# Patient Record
Sex: Female | Born: 2000 | Race: White | Hispanic: No | Marital: Single | Smoking: Current every day smoker
Health system: Southern US, Community
[De-identification: ages and names within clinical notes are randomized; demographics above are authoritative.]

## PROBLEM LIST (undated history)

## (undated) DIAGNOSIS — F909 Attention-deficit hyperactivity disorder, unspecified type: Secondary | ICD-10-CM

---
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
## 2021-02-21 IMAGING — CR DG CHEST 2V
2 series · 2 of 2 positions shown · non-contrast
Comparison: None

CLINICAL DATA: Sore throat for 3 days.

EXAM:
CHEST - 2 VIEW

[chest pa]
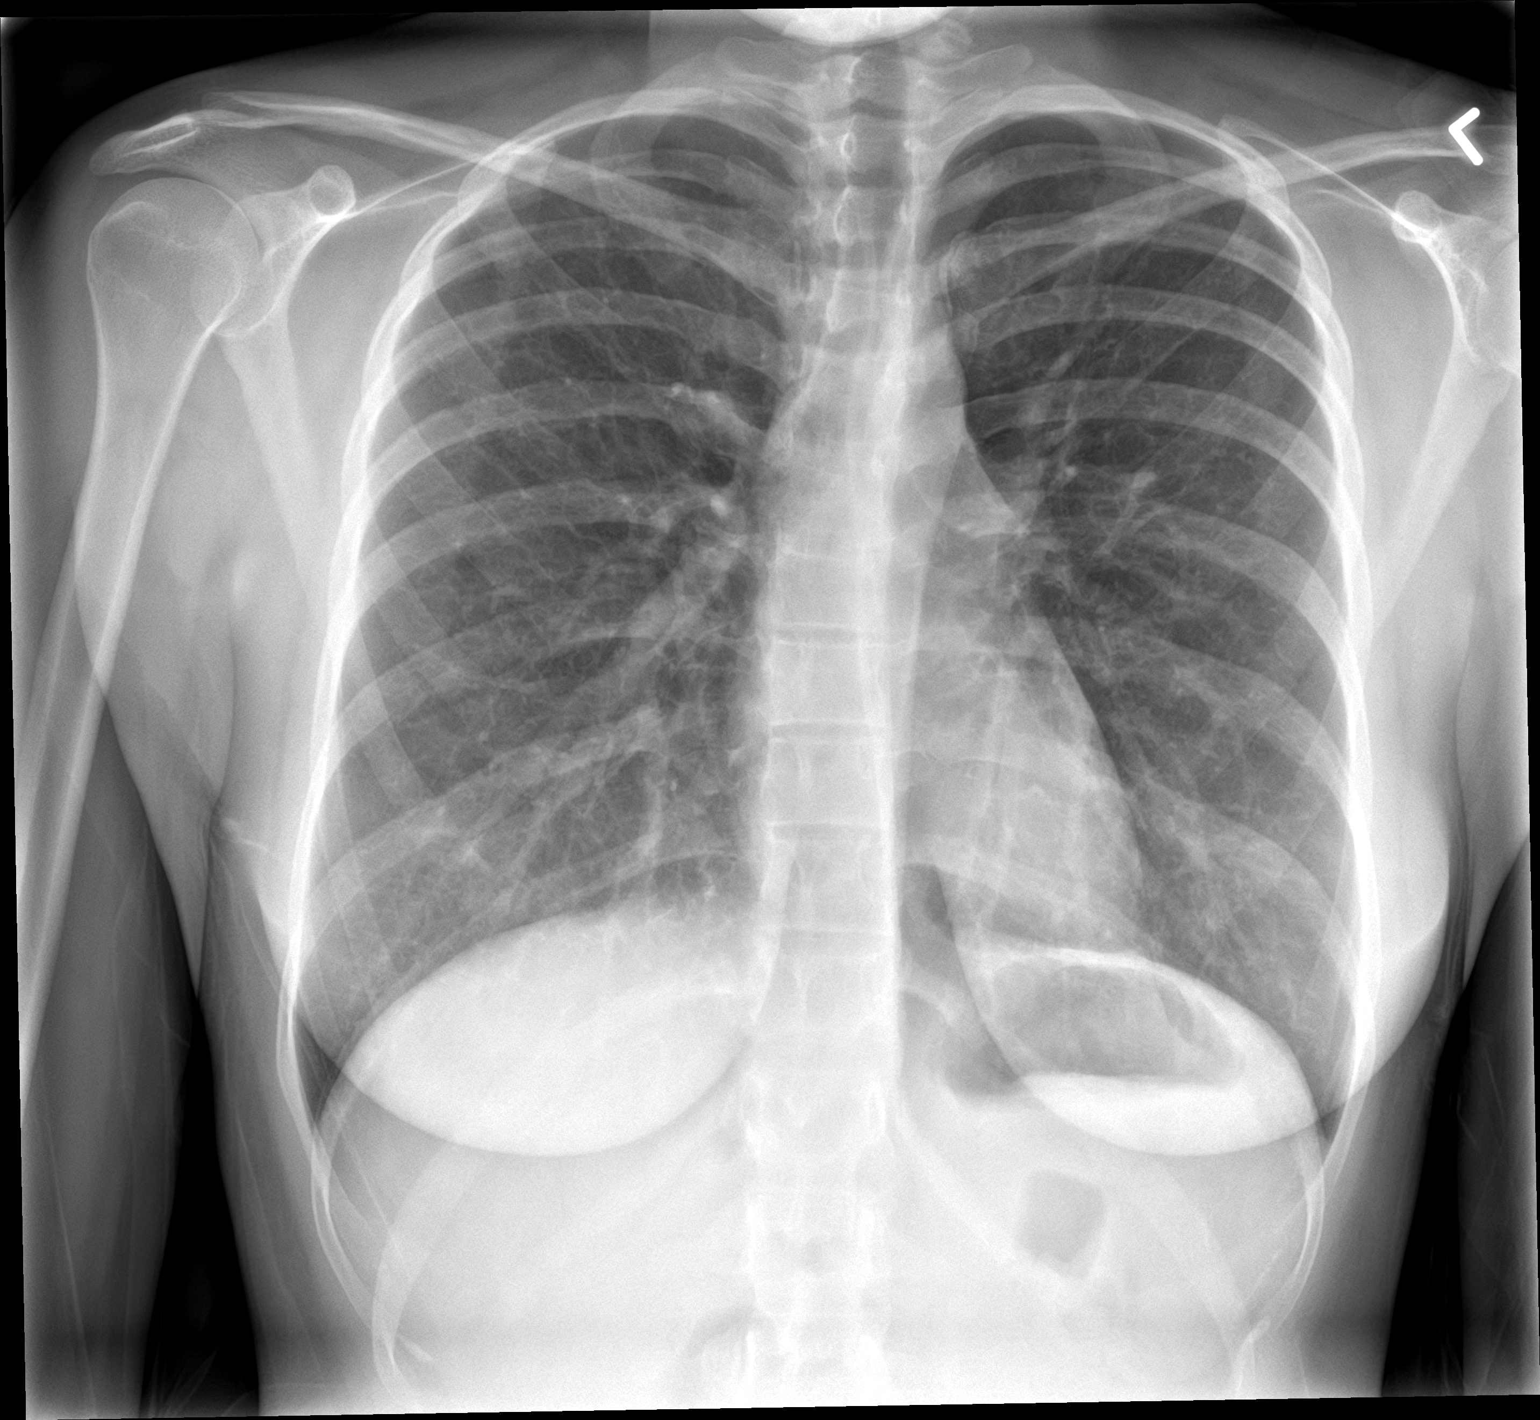

[chest lat]
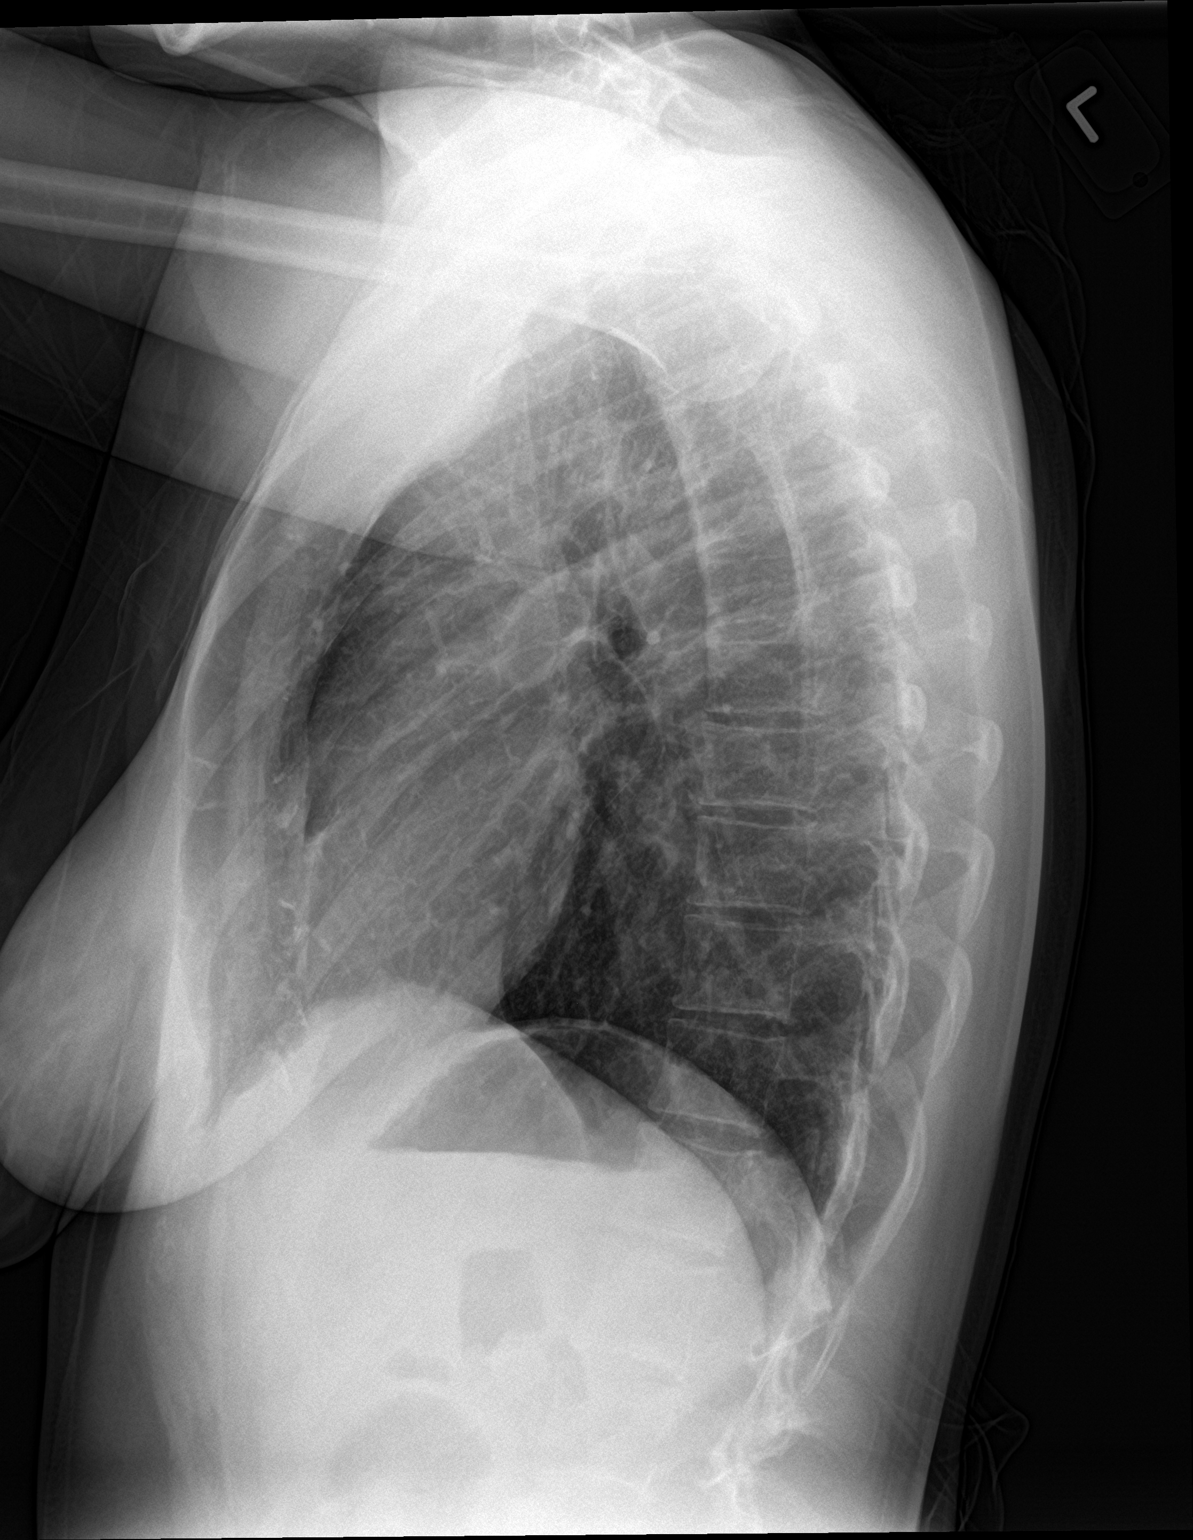

[2 of 2 positions shown; findings below may reference images not displayed]

FINDINGS: Cardiomediastinal contours are normal. Hilar structures are
unremarkable.

Lungs are clear.

No acute bone finding.
IMPRESSION: Normal chest is

## 2021-05-29 IMAGING — CR DG ANKLE 2V *R*
2 series · 2 of 2 positions shown · non-contrast
Comparison: None.

CLINICAL DATA: Lateral ankle pain, swelling and bruising, injury

EXAM:
RIGHT ANKLE - 2 VIEW

[ankle ap]
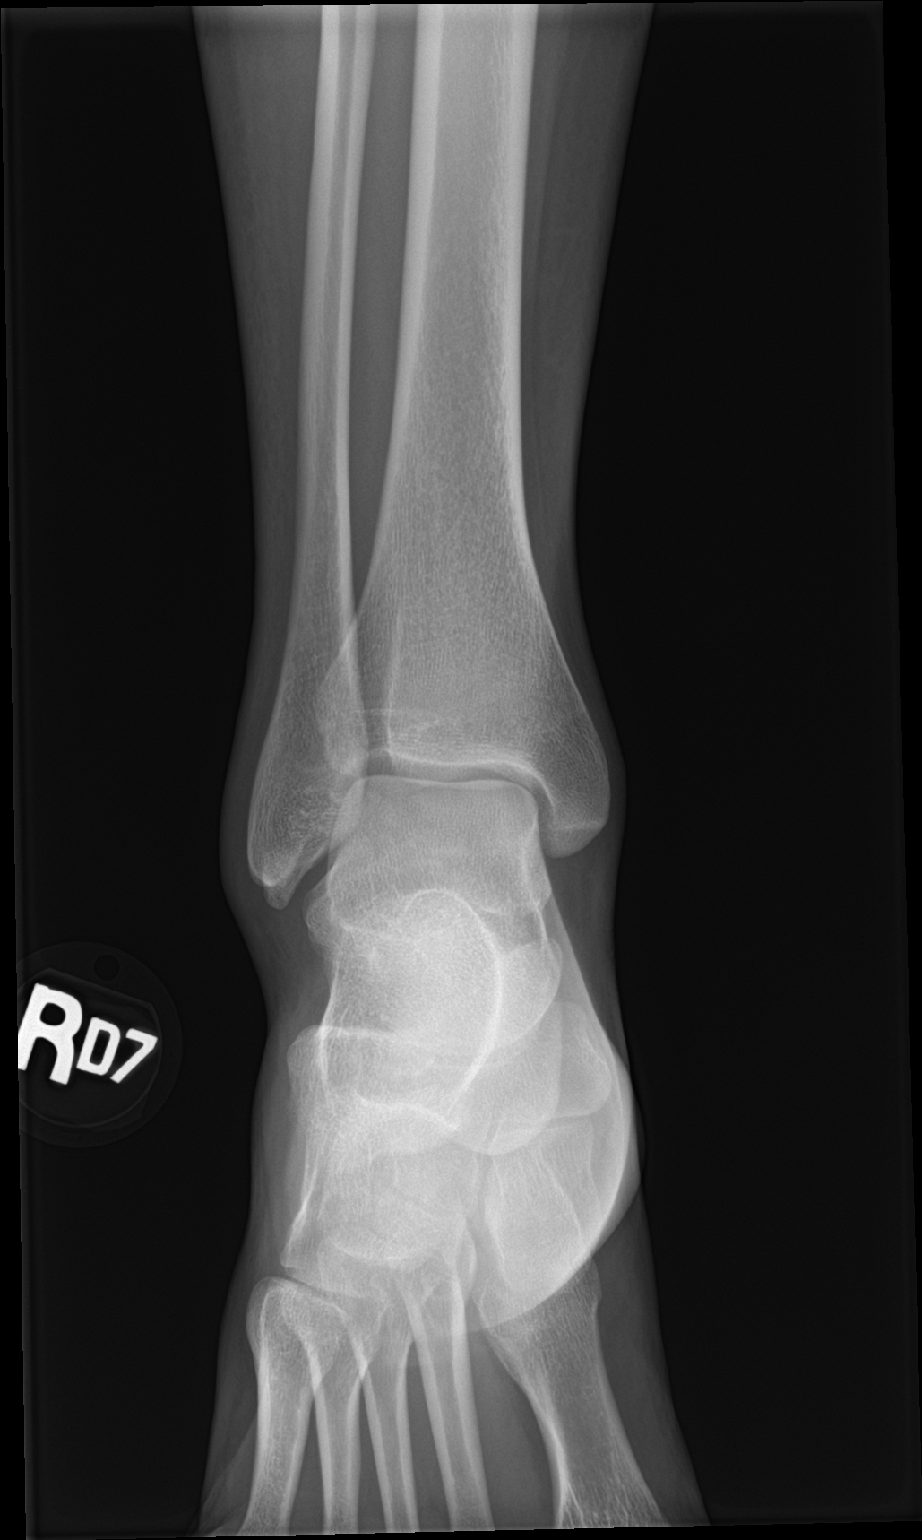

[ankle lat]
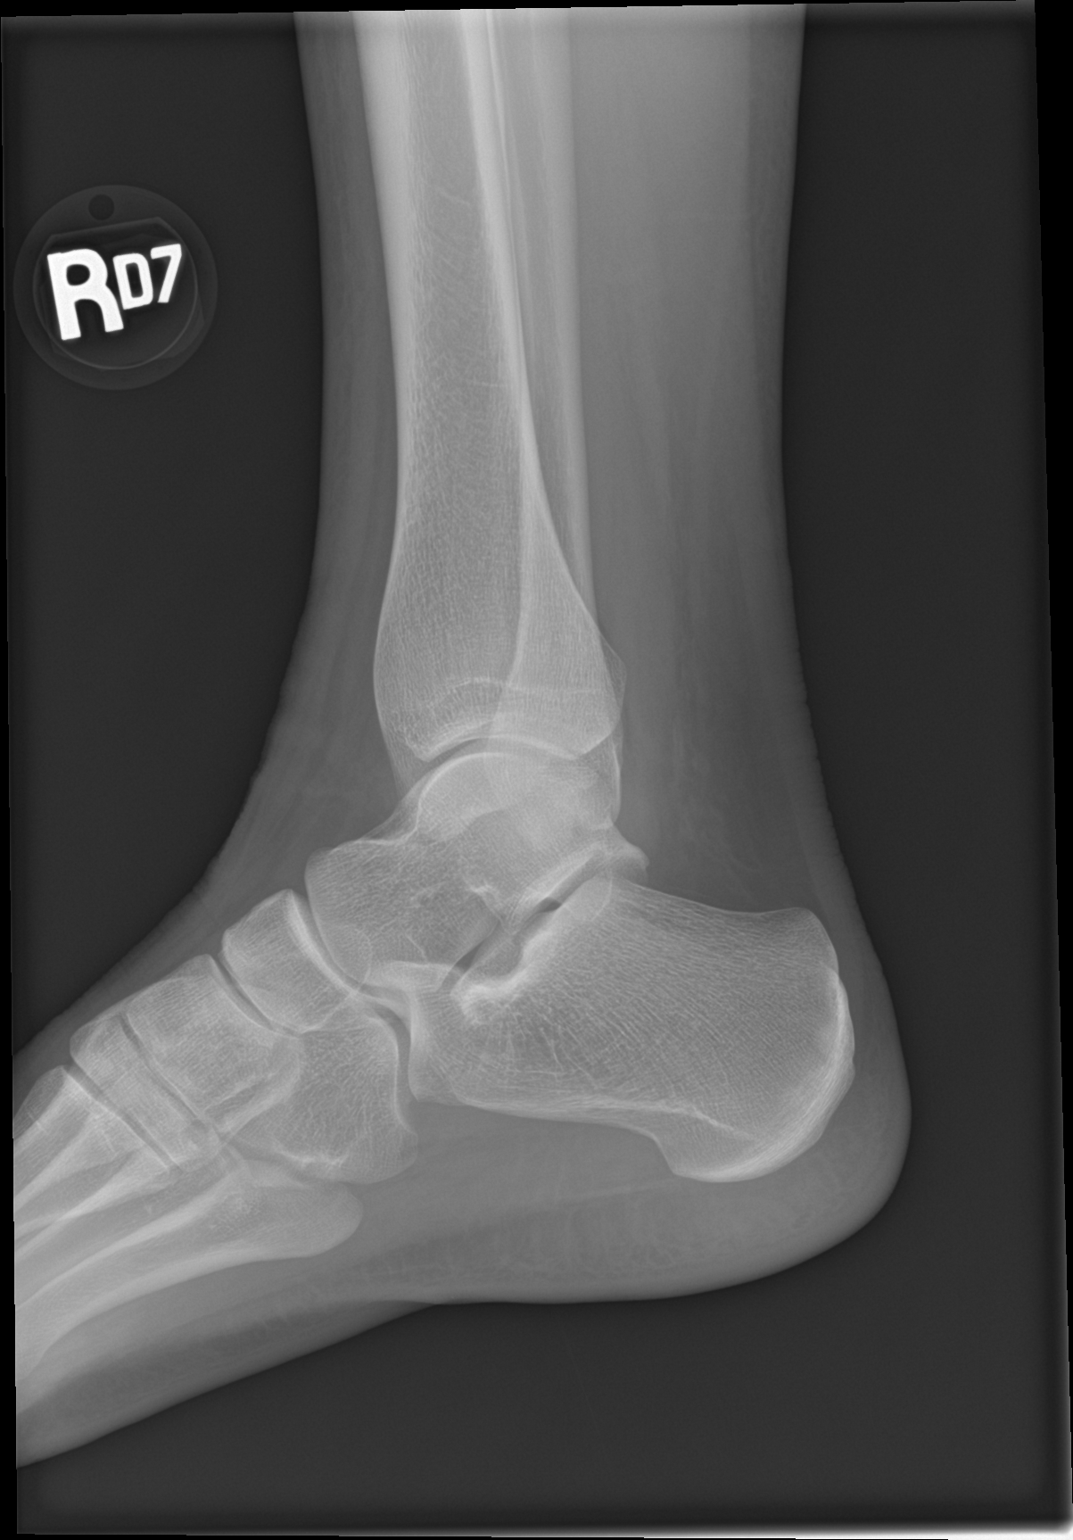

[2 of 2 positions shown; findings below may reference images not displayed]

FINDINGS: There is no evidence of fracture, dislocation, or joint effusion.
There is no evidence of arthropathy or other focal bone abnormality.
Soft tissues are unremarkable.
IMPRESSION: Negative.

## 2021-11-24 ENCOUNTER — Encounter: Payer: Self-pay | Admitting: Adult Health

## 2021-11-24 ENCOUNTER — Ambulatory Visit (INDEPENDENT_AMBULATORY_CARE_PROVIDER_SITE_OTHER): Payer: 59 | Admitting: Adult Health

## 2021-11-24 ENCOUNTER — Other Ambulatory Visit: Payer: Self-pay

## 2021-11-24 VITALS — BP 92/62 | HR 95 | Temp 97.9°F | Ht 64.0 in | Wt 133.0 lb

## 2021-11-24 DIAGNOSIS — J039 Acute tonsillitis, unspecified: Secondary | ICD-10-CM

## 2021-11-24 DIAGNOSIS — Z720 Tobacco use: Secondary | ICD-10-CM | POA: Diagnosis not present

## 2021-11-24 LAB — POCT RAPID STREP A (OFFICE): Rapid Strep A Screen: NEGATIVE

## 2021-11-24 NOTE — Progress Notes (Signed)
[facility] [address] [place], [place] [address] Phone: [phone] Fax: [phone]   Office Visit Note  Patient Name: Paula Orr  Date of Birth:Feb 05, 2001  Med Rec number [id number]  Date of Service: 11/24/2021  Patient has no known allergies.  Chief Complaint  Patient presents with   Sore Throat     Sore Throat  Pertinent negatives include no coughing, diarrhea, ear pain or vomiting.    Patient reports 3 days ago she noticed swollen lymph nodes and has been having difficulty sleeping.  She reports tonsil enlargement, body aches, painful swallowing and chills. Roommate is currently sick, but with different symptoms. She has taken ibuprofen, Nyquil and Dayquil with no real change in symptoms.  Take a hot shower helps, sleeping makes it worse.  Patient does not want to take antibiotics unless she has to. Has a previous RX for Augmentin that she never started.  It is not expired.   Current Medication:  Outpatient Encounter Medications as of 11/24/2021  Medication Sig   amphetamine-dextroamphetamine (ADDERALL) 15 MG tablet Take 1 tablet by mouth 2 (two) times daily.   YAZ 3-0.02 MG tablet Take 1 tablet by mouth daily.   No facility-administered encounter medications on file as of 11/24/2021.      Medical History: Past Medical History:  Diagnosis Date   ADHD      Vital Signs: BP 92/62   Pulse 95   Temp 97.9 F (36.6 C) (Tympanic)   Ht 5\' 4"  (1.626 m)   Wt 133 lb (60.3 kg)   SpO2 99%   BMI 22.83 kg/m    Review of Systems  Constitutional:  Positive for fatigue. Negative for chills and fever.  HENT:  Positive for sore throat. Negative for ear pain.   Eyes:  Negative for pain and itching.  Respiratory:  Negative for cough.   Gastrointestinal:  Negative for diarrhea, nausea and vomiting.  Musculoskeletal:  Positive for myalgias.    Physical Exam Vitals and nursing note reviewed.  Constitutional:      Appearance: She is well-developed.  HENT:      Head: Normocephalic.     Right Ear: Tympanic membrane and ear canal normal.     Left Ear: Tympanic membrane and ear canal normal.     Mouth/Throat:     Mouth: Mucous membranes are moist.     Pharynx: Oropharynx is clear. Posterior oropharyngeal erythema present.     Tonsils: Tonsillar exudate present. No tonsillar abscesses. 0 on the right. 0 on the left.  Cardiovascular:     Rate and Rhythm: Normal rate.  Pulmonary:     Effort: No respiratory distress.     Breath sounds: No wheezing or rales.  Lymphadenopathy:     Cervical: Cervical adenopathy present.  Skin:    General: Skin is warm.  Neurological:     Mental Status: She is alert.    Results for orders placed or performed in visit on 11/24/21 (from the past 24 hour(s))  POCT rapid strep A     Status: Normal   Collection Time: 11/24/21  1:52 PM  Result Value Ref Range   Rapid Strep A Screen Negative Negative    Assessment/Plan: 1. Tonsillitis Does not want antibiotics.  Since she is negative for Strep, I'm comfortable with her using otc meds.  If symptoms persist, she will start taking the Augmentin that she already has. Follow up via MyChart messenger if symptoms fail to improve or may return to clinic as needed for  worsening symptoms.   - POCT rapid strep A  2. Vapes nicotine containing substance Smoking cessation counseling: Pt acknowledges the risks of long term smoking/vaping, she does it rarely. Does not buy her own, only vapes when friends are. Options for different medications including nicotine products, chewing gum, patch etc, Wellbutrin and Chantix is discussed.  Not appropriate at this time. Goal and date of compete cessation is discussed: Pt not interested at this time.  Total time spent in smoking cessation is 10 min.      General Counseling: Maisie verbalizes understanding of the findings of todays visit and agrees with plan of treatment. I have discussed any further diagnostic evaluation that may be needed or  ordered today. We also reviewed her medications today. she has been encouraged to call the office with any questions or concerns that should arise related to todays visit.   Orders Placed This Encounter  Procedures   POCT rapid strep A    No orders of the defined types were placed in this encounter.   Time spent:20 Minutes Time spent includes review of chart, medications, test results, and follow up plan with the patient.    Kendell Bane AGNP-C Nurse Practitioner

## 2022-01-29 ENCOUNTER — Other Ambulatory Visit: Payer: Self-pay

## 2022-01-29 ENCOUNTER — Emergency Department
Admission: EM | Admit: 2022-01-29 | Discharge: 2022-01-29 | Disposition: A | Discharge status: Home / Self Care | Payer: 59 | Attending: Emergency Medicine | Admitting: Emergency Medicine

## 2022-01-29 ENCOUNTER — Emergency Department: Payer: 59

## 2022-01-29 DIAGNOSIS — F10129 Alcohol abuse with intoxication, unspecified: Secondary | ICD-10-CM | POA: Insufficient documentation

## 2022-01-29 DIAGNOSIS — S0990XA Unspecified injury of head, initial encounter: Secondary | ICD-10-CM | POA: Diagnosis present

## 2022-01-29 DIAGNOSIS — W01198A Fall on same level from slipping, tripping and stumbling with subsequent striking against other object, initial encounter: Secondary | ICD-10-CM | POA: Diagnosis not present

## 2022-01-29 MED ORDER — ONDANSETRON 4 MG PO TBDP
4.0000 mg | ORAL_TABLET | Freq: Once | ORAL | Status: DC
  Filled 2022-01-29: qty 1

## 2022-01-29 MED ORDER — ACETAMINOPHEN 500 MG PO TABS
1000.0000 mg | ORAL_TABLET | Freq: Once | ORAL | Status: DC
  Filled 2022-01-29: qty 2

## 2022-01-29 NOTE — Discharge Instructions (Signed)
Take acetaminophen 650 mg and ibuprofen 400 mg every 6 hours for pain.  Take with food. Thank you for choosing us for your health care today!  Please see your primary doctor this week for a follow up appointment.   If you do not have a primary doctor call the following clinics to establish care:  If you have insurance:  [facility] [phone] [address] [place] [address]   [facility]  [phone] [address] [place] [address]   If you do not have insurance:  [facility]  [phone] [address] [place] [address]  Sometimes, in the early stages of certain disease courses it is difficult to detect in the emergency department evaluation -- so, it is important that you continue to monitor your symptoms and call your doctor right away or return to the emergency department if you develop any new or worsening symptoms.  It was my pleasure to care for you today.   Lazara Grieser S. Zackarey Holleman, MD  

## 2022-01-29 NOTE — ED Provider Notes (Signed)
[facility] Provider Note    Event Date/Time   First MD Initiated Contact with Patient 01/29/22 1722     (approximate)   History   Head Injury   HPI  Paula Orr is a 21 y.o. female   Past medical history of otherwise healthy young woman who presents with a head injury while intoxicated at a party last night.  She was drinking alcohol and fell forward onto her chin striking her head no loss of consciousness and no vomiting at that time.  She went home and slept.  This morning she felt nauseous and hung over and vomited several times.  She has ongoing headache.  Blood thinners no other significant past medical history.  No other injuries noted.  No other medical complaints.    History was obtained via the patient. I reviewed external medical notes including emergency department visit dated 03/27/2019 where she came with palpitations and sore throat and had a negative work-up and was discharged.     Physical Exam   Triage Vital Signs: ED Triage Vitals  Enc Vitals Group     BP 01/29/22 1607 (!) 125/102     Pulse Rate 01/29/22 1607 91     Resp 01/29/22 1607 18     Temp 01/29/22 1607 97.7 F (36.5 C)     Temp Source 01/29/22 1607 Oral     SpO2 01/29/22 1607 99 %     Weight 01/29/22 1608 130 lb (59 kg)     Height 01/29/22 1608 5\' 4"  (1.626 m)     Head Circumference --      Peak Flow --      Pain Score 01/29/22 1608 5     Pain Loc --      Pain Edu? --      Excl. in GC? --     Most recent vital signs: Vitals:   01/29/22 1607  BP: (!) 125/102  Pulse: 91  Resp: 18  Temp: 97.7 F (36.5 C)  SpO2: 99%    General: Awake, no distress.  CV:  Good peripheral perfusion.  Resp:  Normal effort.  Abd:  No distention.  Other:  No obvious signs of trauma to the head or neck, neck is supple with full range of motion and no step-off or deformity.  The remainder of her exam shows no acute traumatic injuries and she is moving all extremities full active range  of motion appears comfortable.  No tenderness to the child no loose teeth, no intraoral lesions and she is able to break a tongue depressor test doubt jaw fracture.   ED Results / Procedures / Treatments   Labs (all labs ordered are listed, but only abnormal results are displayed) Labs Reviewed - No data to display    RADIOLOGY I independently reviewed and interpreted CT of the head and see no obvious bleeding or midline shift.   PROCEDURES:  Critical Care performed: No  Procedures   MEDICATIONS ORDERED IN ED: Medications  ondansetron (ZOFRAN-ODT) disintegrating tablet 4 mg (4 mg Oral Patient Refused/Not Given 01/29/22 1752)  acetaminophen (TYLENOL) tablet 1,000 mg (1,000 mg Oral Patient Refused/Not Given 01/29/22 1751)   IMPRESSION / MDM / ASSESSMENT AND PLAN / ED COURSE  I reviewed the triage vital signs and the nursing notes.                              Differential diagnosis includes, but is  not limited to, intracranial bleeding, facial fracture, intraoral fractures or trauma  MDM: Fall while intoxicated with multiple episodes of emesis the morning afterwards with ongoing headache will get CT scan of the head to assess for intracranial bleeding.  Fortunately negative.  Patient with no other injuries noted and comfortable in the emergency department, she was able to bite down on a tongue depressor and I was able to twist and break it without eliciting pain, doubt mandibular fracture.  No intraoral trauma noted.  Anticipatory guidance for concussion and return precautions, advised follow-up with PMD.  Patient's presentation is most consistent with acute presentation with potential threat to life or bodily function.       FINAL CLINICAL IMPRESSION(S) / ED DIAGNOSES   Final diagnoses:  Minor head injury, initial encounter     Rx / DC Orders   ED Discharge Orders     None        Note:  This document was prepared using Dragon voice recognition software and may  include unintentional dictation errors.    Pilar Jarvis, MD 01/29/22 [phone]

## 2022-01-29 NOTE — ED Triage Notes (Signed)
Pt states she fell and hit her head- pt states she has been throwing up all day but is hungover- pt states she fell off a stage about 1 foot off the ground

## 2022-05-10 ENCOUNTER — Ambulatory Visit (INDEPENDENT_AMBULATORY_CARE_PROVIDER_SITE_OTHER): Payer: 59 | Admitting: Oncology

## 2022-05-10 VITALS — BP 102/58 | HR 78 | Temp 98.1°F | Resp 18 | Ht 64.0 in | Wt 130.0 lb

## 2022-05-10 DIAGNOSIS — R0981 Nasal congestion: Secondary | ICD-10-CM | POA: Diagnosis not present

## 2022-05-10 DIAGNOSIS — S99912A Unspecified injury of left ankle, initial encounter: Secondary | ICD-10-CM

## 2022-05-10 NOTE — Progress Notes (Signed)
Paula Orr, [place] [address] Phone: [phone] Fax: [phone]   Office Visit Note  Patient Name: Paula Orr  Date of [id number]  Med Rec number [id number]  Date of Service: 05/10/2022  Patient has no known allergies.  Chief Complaint  Patient presents with   Ankle Pain   Patient is an 22 y.o. student here for complaints of left ankle pain that she developed last Saturday. No obvious injury but does report being on her feet and walking a lot that day.  Pain is described as a dull and fairly constant when walking to the back of her heel.  Notices it more when she is walking uphill. Improves with rest. Has been soaking her foot in Epson salt and puts ice on it a few times daily.  Does not feel like it has significantly improved.  Has not been taking anything for pain.  No swelling or bruising that she noticed.  Has also been having some nasal congestion and scratchy throat for the past few days.  Has not tried anything over-the-counter.  No fevers.  No cough.  Current Medication:  Outpatient Encounter Medications as of 05/10/2022  Medication Sig   amphetamine-dextroamphetamine (ADDERALL) 15 MG tablet Take 1 tablet by mouth 2 (two) times daily.   YAZ 3-0.02 MG tablet Take 1 tablet by mouth daily.   No facility-administered encounter medications on file as of 05/10/2022.      Medical History: Past Medical History:  Diagnosis Date   ADHD      Vital Signs: BP (!) 102/58   Pulse 78   Temp 98.1 F (36.7 C) (Tympanic)   Resp 18   Ht '5\' 4"'$  (1.626 m)   Wt 130 lb (59 kg)   SpO2 99%   BMI 22.31 kg/m   ROS: As per HPI.  All other pertinent ROS negative.     Review of Systems  HENT:  Positive for congestion, postnasal drip and sore throat.   Musculoskeletal:  Positive for arthralgias (Left ankle).    Physical Exam Vitals reviewed.  Constitutional:      Appearance: Normal appearance.  HENT:     Mouth/Throat:     Mouth: Mucous  membranes are moist.     Pharynx: Oropharynx is clear. Posterior oropharyngeal erythema present.     Comments: PND Musculoskeletal:     Left ankle: No swelling or deformity. No tenderness. Normal range of motion.     Left Achilles Tendon: No tenderness.     Comments: No reproducible pain with palpation.  No abnormality on exam.  No decreased range of motion.  Neurological:     Mental Status: She is alert.     No results found for this or any previous visit (from the past 24 hour(s)).  Assessment/Plan: 1. Nasal congestion -Discussed trying over-the-counter antihistamine with Claritin or Zyrtec and Flonase 2 sprays each nostril once or twice a day  -Discussed this is likely due to seasonal allergies or a virus.  Please let me know if this worsens  2. Left ankle injury, initial encounter -No obvious deformity on exam today.  No reproducible tenderness.  Appears to be a strain or sprain to Achilles tendon. -Discussed applying an ankle wrap when walking long distances.  -Recommend ibuprofen or Advil prior to long walks as well.  Discussed 2-3 tabs 4-600 mg at a time. -Continue to apply ice as needed for comfort.  May also continue Epsom salt baths.   -Please let me know if  your symptoms worsen or fail to improve.  Disposition-return to clinic as needed.  General Counseling: [name] verbalizes understanding of the findings of todays visit and agrees with plan of treatment. I have discussed any further diagnostic evaluation that may be needed or ordered today. We also reviewed her medications today. she has been encouraged to call the office with any questions or concerns that should arise related to todays visit.   No orders of the defined types were placed in this encounter.   No orders of the defined types were placed in this encounter.   I spent 20 minutes dedicated to the care of this patient (face-to-face and non-face-to-face) on the date of the encounter to include what is described  in the assessment and plan.   Faythe Casa, NP 05/10/2022 9:51 AM

## 2022-05-25 ENCOUNTER — Encounter: Payer: Self-pay | Admitting: Oncology
